# Patient Record
Sex: Male | Born: 1960 | Race: White | Hispanic: No | Marital: Single | Smoking: Never smoker
Health system: Southern US, Community
[De-identification: ages and names within clinical notes are randomized; demographics above are authoritative.]

## PROBLEM LIST (undated history)

## (undated) DIAGNOSIS — K279 Peptic ulcer, site unspecified, unspecified as acute or chronic, without hemorrhage or perforation: Secondary | ICD-10-CM

## (undated) DIAGNOSIS — E079 Disorder of thyroid, unspecified: Secondary | ICD-10-CM

## (undated) DIAGNOSIS — K219 Gastro-esophageal reflux disease without esophagitis: Secondary | ICD-10-CM

---

## 2016-11-11 ENCOUNTER — Emergency Department: Payer: Medicaid Other

## 2016-11-11 ENCOUNTER — Emergency Department
Admission: EM | Admit: 2016-11-11 | Discharge: 2016-11-11 | Disposition: A | Discharge status: Home / Self Care | Payer: Medicaid Other | Attending: Emergency Medicine | Admitting: Emergency Medicine

## 2016-11-11 ENCOUNTER — Encounter: Payer: Self-pay | Admitting: Emergency Medicine

## 2016-11-11 ENCOUNTER — Other Ambulatory Visit: Payer: Self-pay

## 2016-11-11 DIAGNOSIS — R0602 Shortness of breath: Secondary | ICD-10-CM | POA: Diagnosis not present

## 2016-11-11 DIAGNOSIS — R0789 Other chest pain: Secondary | ICD-10-CM | POA: Diagnosis present

## 2016-11-11 DIAGNOSIS — R079 Chest pain, unspecified: Secondary | ICD-10-CM | POA: Diagnosis not present

## 2016-11-11 HISTORY — DX: Disorder of thyroid, unspecified: E07.9

## 2016-11-11 HISTORY — DX: Peptic ulcer, site unspecified, unspecified as acute or chronic, without hemorrhage or perforation: K27.9

## 2016-11-11 HISTORY — DX: Gastro-esophageal reflux disease without esophagitis: K21.9

## 2016-11-11 LAB — CBC
HEMATOCRIT: 44.1 % (ref 40.0–52.0)
Hemoglobin: 15.2 g/dL (ref 13.0–18.0)
MCH: 29.9 pg (ref 26.0–34.0)
MCHC: 34.4 g/dL (ref 32.0–36.0)
MCV: 86.8 fL (ref 80.0–100.0)
Platelets: 193 10*3/uL (ref 150–440)
RBC: 5.09 MIL/uL (ref 4.40–5.90)
RDW: 13.5 % (ref 11.5–14.5)
WBC: 5.7 10*3/uL (ref 3.8–10.6)

## 2016-11-11 LAB — BASIC METABOLIC PANEL
Anion gap: 8 (ref 5–15)
BUN: 18 mg/dL (ref 6–20)
CHLORIDE: 107 mmol/L (ref 101–111)
CO2: 25 mmol/L (ref 22–32)
CREATININE: 1.09 mg/dL (ref 0.61–1.24)
Calcium: 9.2 mg/dL (ref 8.9–10.3)
GFR calc Af Amer: >60 mL/min (ref >60)
GFR calc non Af Amer: >60 mL/min (ref >60)
GLUCOSE: 102 mg/dL — AB (ref 65–99)
POTASSIUM: 3.7 mmol/L (ref 3.5–5.1)
Sodium: 140 mmol/L (ref 135–145)

## 2016-11-11 LAB — TROPONIN I: Troponin I: <0.03 ng/mL (ref <0.03)

## 2016-11-11 MED ORDER — GUAIFENESIN-CODEINE 100-10 MG/5ML PO SOLN: 5.0000 mL | Freq: Four times a day (QID) | ORAL | 0 refills | Status: DC | PRN

## 2016-11-11 NOTE — Discharge Instructions (Signed)
You have been seen in the emergency department today for chest pain. Your workup has shown normal results. As we discussed please follow-up with your primary care physician in the next 1-2 days for recheck. Return to the emergency department for any further chest pain, trouble breathing, or any other symptom personally concerning to yourself.  Please call the number provided for cardiology to arrange a follow-up appointment for consideration of possible stress test.

## 2016-11-11 NOTE — ED Provider Notes (Signed)
[facility] Emergency Department Provider Note  Time seen: 5:23 PM  I have reviewed the triage vital signs and the nursing notes.   HISTORY  Chief Complaint Shortness of Breath    HPI Joe Shaffer is a 56 y.o. male With a past medical history of gastric reflux presents the emergency department for chest discomfort and cough. According to the patient for the past one year he has been experiencing intermittent chest discomfort. States he was incarcerated until 5 months ago. States since getting out of prison he has continued to have intermittent chest pain, but has not yet been properly evaluated. States even in prison when he was evaluated he believes the health care provided was poor. Patient states over the past several weeks he has had increased cough, with mild shortness of breath. Intermittent chest pain which she describes as central chest discomfort. Occasional sputum production. Denies any fever. No leg pain or swelling at this time.  Past Medical History:  Diagnosis Date  . GERD (gastroesophageal reflux disease)   . Peptic ulcer   . Thyroid disease     There are no active problems to display for this patient.   History reviewed. No pertinent surgical history.  Prior to Admission medications   Not on File    No Known Allergies  No family history on file.  Social History Social History  Substance Use Topics  . Smoking status: Never Smoker  . Smokeless tobacco: Never Used  . Alcohol use Not on file    Review of Systems Constitutional: Negative for fever Cardiovascular: intermittent chest pain times one year Respiratory: positive shortness breath, cough. Gastrointestinal: Negative for abdominal pain Musculoskeletal: negative for leg pain or swelling. All other ROS negative  ____________________________________________   PHYSICAL EXAM:  VITAL SIGNS: ED Triage Vitals  Enc Vitals Group     BP 11/11/16 1449 (!) 132/93     Pulse  Rate 11/11/16 1449 82     Resp 11/11/16 1449 20     Temp 11/11/16 1449 98.1 F (36.7 C)     Temp Source 11/11/16 1449 Oral     SpO2 11/11/16 1449 96 %     Weight 11/11/16 1450 224 lb (101.6 kg)     Height 11/11/16 1450  (1.727 m)     Head Circumference --      Peak Flow --      Pain Score 11/11/16 1507 7     Pain Loc --      Pain Edu? --      Excl. in GC? --     Constitutional: Alert and oriented. Well appearing and in no distress. Eyes: Normal exam ENT   Head: Normocephalic and atraumatic.   Mouth/Throat: Mucous membranes are moist. Cardiovascular: Normal rate, regular rhythm. No murmur Respiratory: Normal respiratory effort without tachypnea nor retractions. Breath sounds are clear. No wheezes rales or rhonchi. Gastrointestinal: Soft and nontender. No distention.  Musculoskeletal: Nontender with normal range of motion in all extremities. No lower extremity tenderness Neurologic:  Normal speech and language. No gross focal neurologic deficits  Skin:  Skin is warm, dry and intact.  Psychiatric: Mood and affect are normal. Speech and behavior are normal.   ____________________________________________    EKG  EKG reviewed and interpreted by myself as normal sinus rhythm at 84 bpm, narrow QRS, normal axis, normal intervals, no concerning ST changes.  ____________________________________________    RADIOLOGY  chest x-ray does not show any acute abnormality.  ____________________________________________   INITIAL  IMPRESSION / ASSESSMENT AND PLAN / ED COURSE  Pertinent labs & imaging results that were available during my care of the patient were reviewed by me and considered in my medical decision making (see chart for details).  patient presents to the emergency department for cough or shortness of breath and intermittent chest pain. States his symptoms have been ongoing for over a year but worse over the past 1-2 weeks. Patient sent by his primary care doctor  for further evaluation. Differential this time would include ACS, bronchitis, COPD, pneumonia, URI. Patient's workup has been largely normal. His labs including troponin are negative/normal. Chest x-ray shows hyperinflation but no acute abnormality. EKG is reassuring. Overall the patient appears very well with a normal physical exam. Patient does have chest pain at times, coughing at times. However given his normal workup I believe the patient is safe for discharge home with cardiology follow-up for a stress test. We will prescribe a short course of cough medication to help with the patient's symptoms. I provided my normal chest pain return precautions.  ____________________________________________   FINAL CLINICAL IMPRESSION(S) / ED DIAGNOSES  chest pain    Minna Antis, MD 11/11/16 1727

## 2016-11-11 NOTE — ED Triage Notes (Signed)
Went to [facility].  Was sent for chest xray, but they did not order it and instructed him to come to ED.  Pt says he has chest pain that is pressure.

## 2016-12-29 ENCOUNTER — Ambulatory Visit: Payer: Medicaid Other | Attending: Neurology

## 2016-12-29 DIAGNOSIS — G4761 Periodic limb movement disorder: Secondary | ICD-10-CM | POA: Insufficient documentation

## 2016-12-29 DIAGNOSIS — G4733 Obstructive sleep apnea (adult) (pediatric): Secondary | ICD-10-CM | POA: Diagnosis not present

## 2016-12-29 DIAGNOSIS — G4710 Hypersomnia, unspecified: Secondary | ICD-10-CM | POA: Insufficient documentation

## 2017-02-03 ENCOUNTER — Ambulatory Visit: Payer: Medicaid Other | Attending: Neurology

## 2017-02-03 DIAGNOSIS — G4761 Periodic limb movement disorder: Secondary | ICD-10-CM | POA: Diagnosis not present

## 2017-02-03 DIAGNOSIS — G4733 Obstructive sleep apnea (adult) (pediatric): Secondary | ICD-10-CM | POA: Diagnosis not present

## 2017-12-31 ENCOUNTER — Ambulatory Visit: Payer: Medicaid Other | Attending: Neurology

## 2017-12-31 DIAGNOSIS — G4761 Periodic limb movement disorder: Secondary | ICD-10-CM | POA: Insufficient documentation

## 2017-12-31 DIAGNOSIS — G4733 Obstructive sleep apnea (adult) (pediatric): Secondary | ICD-10-CM | POA: Diagnosis present

## 2018-02-19 ENCOUNTER — Encounter: Payer: Self-pay | Admitting: Internal Medicine

## 2018-02-19 ENCOUNTER — Emergency Department: Payer: Medicaid Other

## 2018-02-19 ENCOUNTER — Other Ambulatory Visit: Payer: Self-pay

## 2018-02-19 ENCOUNTER — Emergency Department
Admission: EM | Admit: 2018-02-19 | Discharge: 2018-02-19 | Disposition: A | Discharge status: Home / Self Care | Payer: Medicaid Other | Attending: Emergency Medicine | Admitting: Emergency Medicine

## 2018-02-19 DIAGNOSIS — J101 Influenza due to other identified influenza virus with other respiratory manifestations: Secondary | ICD-10-CM | POA: Diagnosis not present

## 2018-02-19 DIAGNOSIS — R0602 Shortness of breath: Secondary | ICD-10-CM | POA: Diagnosis present

## 2018-02-19 DIAGNOSIS — J111 Influenza due to unidentified influenza virus with other respiratory manifestations: Secondary | ICD-10-CM

## 2018-02-19 LAB — GROUP A STREP BY PCR: GROUP A STREP BY PCR: NOT DETECTED

## 2018-02-19 LAB — INFLUENZA PANEL BY PCR (TYPE A & B)
INFLAPCR: NEGATIVE
INFLBPCR: POSITIVE — AB

## 2018-02-19 MED ORDER — HYDROCOD POLST-CPM POLST ER 10-8 MG/5ML PO SUER: 5.0000 mL | Freq: Every evening | ORAL | 0 refills | Status: DC | PRN

## 2018-02-19 MED ORDER — DEXAMETHASONE SODIUM PHOSPHATE 10 MG/ML IJ SOLN
10.0000 mg | Freq: Once | INTRAMUSCULAR | Status: AC
  Administered 2018-02-19: 10 mg via INTRAMUSCULAR
  Filled 2018-02-19: qty 1

## 2018-02-19 NOTE — ED Provider Notes (Addendum)
[facility] Emergency Department Provider Note ____________________________________________  Time seen: 2115  I have reviewed the triage vital signs and the nursing notes.  HISTORY  Chief Complaint  Cough and Shortness of Breath   HPI Joe Shaffer is a 58 y.o. male presents to the ER today with complaint of runny nose, nasal congestion gesturing, ear fullness, sore throat, cough and shortness of breath.  He reports this started 1 week ago.  He is not able to blow anything out of his nose.  He denies drainage from the ears but does report some decreased hearing.  He is having some difficulty swallowing.  The cough is productive of yellow/green mucus.  He is short of breath with exertion but denies chest pain.  He denies fever, chills or body aches.  He has taken NyQuil over-the-counter with minimal relief.  He has no history of asthma or COPD.  He has not had sick contacts.  He did not get his flu shot this year.  Past Medical History:  Diagnosis Date  . GERD (gastroesophageal reflux disease)   . Peptic ulcer   . Thyroid disease     There are no active problems to display for this patient.   History reviewed. No pertinent surgical history.  Prior to Admission medications   Medication Sig Start Date End Date Taking? Authorizing Provider  chlorpheniramine-HYDROcodone (TUSSIONEX PENNKINETIC ER) 10-8 MG/5ML SUER Take 5 mLs by mouth at bedtime as needed for cough. 02/19/18   Lorre Munroe, NP    Allergies Patient has no known allergies.  No family history on file.  Social History Social History   Tobacco Use  . Smoking status: Never Smoker  . Smokeless tobacco: Never Used  Substance Use Topics  . Alcohol use: Not on file  . Drug use: Not on file    Review of Systems  Constitutional: Negative for fever, chills or body aches. ENT: Positive for runny nose, nasal congestion, ear fullness and sore throat. Cardiovascular: Negative for chest  pain. Respiratory: Positive for cough and shortness of breath. Gastrointestinal: Negative for nausea, vomiting and diarrhea.  ____________________________________________  PHYSICAL EXAM:  VITAL SIGNS: ED Triage Vitals  Enc Vitals Group     BP 02/19/18 2041 132/77     Pulse Rate 02/19/18 2041 84     Resp 02/19/18 2041 18     Temp 02/19/18 2041 98.6 F (37 C)     Temp Source 02/19/18 2041 Oral     SpO2 02/19/18 2041 99 %     Weight 02/19/18 2038 222 lb (100.7 kg)     Height 02/19/18 2038 5\' 8"  (1.727 m)     Head Circumference --      Peak Flow --      Pain Score 02/19/18 2038 0     Pain Loc --      Pain Edu? --      Excl. in GC? --     Constitutional: Alert and oriented. Well appearing and in no distress. Head: Normocephalic and atraumatic. Ears: Bilateral cerumen impaction. Nose: Mucosa boggy and moist, turbinates swollen. Mouth/Throat: Mucous membranes are moist.  Tonsils 2+ with erythema but without exudate.  Positive PND. Hematological/Lymphatic/Immunological: No cervical lymphadenopathy. Cardiovascular: Normal rate, regular rhythm.  Respiratory: Normal respiratory effort with diminished breath sounds. No wheezes/rales/rhonchi. Neurologic:   Normal speech and language. No gross focal neurologic deficits are appreciated.  ____________________________________________   LABS   Lab Orders     Group A Strep by PCR ([facility] Only)  Influenza panel by PCR (type A & B)  ____________________________________________   RADIOLOGY  Imaging Orders     DG Chest 2 View IMPRESSION:  No acute abnormalities.      ____________________________________________   INITIAL IMPRESSION / ASSESSMENT AND PLAN / ED COURSE  Runny Nose, Ear Fullness, Nasal Congestion, Sore Throat, Cough and SOB:  DDX include allergic rhinitis, viral URI with cough, strep pharyngitis, viral pharyngitis, acute bronchitis or pneumonia  Discussed lack of reasoning to test for the flu as he is 5 days  out Chest xray negative Rapid Strep negative Rapid flu positive Too far out for Tamiflu Decadron 10 mg IM today Use Flonase, Allegra OTC RX for Tussionex for cough ____________________________________________  FINAL CLINICAL IMPRESSION(S) / ED DIAGNOSES  Final diagnoses:  Influenza   Nicki Reaper, NP     Lorre Munroe, NP 02/19/18 2300    Lorre Munroe, NP 02/19/18 2303    Emily Filbert, MD 02/19/18 [phone]

## 2018-02-19 NOTE — ED Triage Notes (Signed)
Patient reports cough for several days that makes him short of breath.  Reports productive with occasional green sputum.

## 2018-02-19 NOTE — Discharge Instructions (Addendum)
You have been diagnosed with the flu.  Your chest x-ray was negative for pneumonia.  You are too far out to be treated with Tamiflu.  I suggest you start Allegra 1 tab daily at night x1 week.  I suggest you start Flonase 1 spray each nostril daily in the morning x1 week.  I am giving you a prescription for codeine cough syrup.  Follow-up as needed with your PCP if symptoms persist or worsen

## 2018-06-30 ENCOUNTER — Emergency Department: Payer: Medicaid Other

## 2018-06-30 ENCOUNTER — Emergency Department
Admission: EM | Admit: 2018-06-30 | Discharge: 2018-06-30 | Disposition: A | Discharge status: Home / Self Care | Payer: Medicaid Other | Attending: Emergency Medicine | Admitting: Emergency Medicine

## 2018-06-30 ENCOUNTER — Other Ambulatory Visit: Payer: Self-pay

## 2018-06-30 ENCOUNTER — Encounter: Payer: Self-pay | Admitting: Emergency Medicine

## 2018-06-30 DIAGNOSIS — Y998 Other external cause status: Secondary | ICD-10-CM | POA: Diagnosis not present

## 2018-06-30 DIAGNOSIS — F0781 Postconcussional syndrome: Secondary | ICD-10-CM | POA: Diagnosis not present

## 2018-06-30 DIAGNOSIS — Y9389 Activity, other specified: Secondary | ICD-10-CM | POA: Diagnosis not present

## 2018-06-30 DIAGNOSIS — S0990XA Unspecified injury of head, initial encounter: Secondary | ICD-10-CM | POA: Insufficient documentation

## 2018-06-30 DIAGNOSIS — Y9289 Other specified places as the place of occurrence of the external cause: Secondary | ICD-10-CM | POA: Insufficient documentation

## 2018-06-30 MED ORDER — HYDROCODONE-ACETAMINOPHEN 5-325 MG PO TABS: 1.0000 | ORAL_TABLET | Freq: Four times a day (QID) | ORAL | 0 refills | Status: DC | PRN
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
## 2018-06-30 NOTE — Discharge Instructions (Addendum)
Follow-up with your regular doctor as needed.  Follow-up with Dr. Malvin Johns at [facility] due to the postconcussion syndrome.  Typically we do not give pain medication for headaches associated with concussions.  Take the pain medication sparingly.  Try Tylenol first.  If this helps to alleviate the pain do not take the narcotic pain medicine.

## 2018-06-30 NOTE — ED Triage Notes (Signed)
Alert oriented, no confusion noted. Denies vomiting.
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
## 2018-10-24 ENCOUNTER — Encounter: Payer: Self-pay | Admitting: Emergency Medicine

## 2018-10-24 ENCOUNTER — Emergency Department
Admission: EM | Admit: 2018-10-24 | Discharge: 2018-10-25 | Disposition: A | Discharge status: Home / Self Care | Payer: Medicaid Other | Attending: Emergency Medicine | Admitting: Emergency Medicine

## 2018-10-24 ENCOUNTER — Emergency Department: Payer: Medicaid Other

## 2018-10-24 ENCOUNTER — Other Ambulatory Visit: Payer: Self-pay

## 2018-10-24 DIAGNOSIS — F1010 Alcohol abuse, uncomplicated: Secondary | ICD-10-CM

## 2018-10-24 DIAGNOSIS — F1110 Opioid abuse, uncomplicated: Secondary | ICD-10-CM | POA: Diagnosis not present

## 2018-10-24 DIAGNOSIS — R51 Headache: Secondary | ICD-10-CM | POA: Insufficient documentation

## 2018-10-24 DIAGNOSIS — R7989 Other specified abnormal findings of blood chemistry: Secondary | ICD-10-CM | POA: Insufficient documentation

## 2018-10-24 DIAGNOSIS — R413 Other amnesia: Secondary | ICD-10-CM | POA: Diagnosis not present

## 2018-10-24 DIAGNOSIS — Z20828 Contact with and (suspected) exposure to other viral communicable diseases: Secondary | ICD-10-CM | POA: Insufficient documentation

## 2018-10-24 DIAGNOSIS — Z79899 Other long term (current) drug therapy: Secondary | ICD-10-CM | POA: Diagnosis not present

## 2018-10-24 LAB — COMPREHENSIVE METABOLIC PANEL
ALT: 35 U/L (ref 0–44)
AST: 43 U/L — ABNORMAL HIGH (ref 15–41)
Albumin: 4.3 g/dL (ref 3.5–5.0)
Alkaline Phosphatase: 67 U/L (ref 38–126)
Anion gap: 12 (ref 5–15)
BUN: 16 mg/dL (ref 6–20)
CO2: 24 mmol/L (ref 22–32)
Calcium: 9.4 mg/dL (ref 8.9–10.3)
Chloride: 103 mmol/L (ref 98–111)
Creatinine, Ser: 0.99 mg/dL (ref 0.61–1.24)
GFR calc Af Amer: >60 mL/min (ref >60)
GFR calc non Af Amer: >60 mL/min (ref >60)
Glucose, Bld: 119 mg/dL — ABNORMAL HIGH (ref 70–99)
Potassium: 3.5 mmol/L (ref 3.5–5.1)
Sodium: 139 mmol/L (ref 135–145)
Total Bilirubin: 1.4 mg/dL — ABNORMAL HIGH (ref 0.3–1.2)
Total Protein: 7.5 g/dL (ref 6.5–8.1)

## 2018-10-24 LAB — URINE DRUG SCREEN, QUALITATIVE (ARMC ONLY)
Amphetamines, Ur Screen: NOT DETECTED
Barbiturates, Ur Screen: NOT DETECTED
Benzodiazepine, Ur Scrn: NOT DETECTED
Cannabinoid 50 Ng, Ur ~~LOC~~: NOT DETECTED
Cocaine Metabolite,Ur ~~LOC~~: NOT DETECTED
MDMA (Ecstasy)Ur Screen: NOT DETECTED
Methadone Scn, Ur: NOT DETECTED
Opiate, Ur Screen: POSITIVE — AB
Phencyclidine (PCP) Ur S: NOT DETECTED
Tricyclic, Ur Screen: NOT DETECTED

## 2018-10-24 LAB — TSH: TSH: 9.991 u[IU]/mL — ABNORMAL HIGH (ref 0.350–4.500)

## 2018-10-24 LAB — CBC
HCT: 47.3 % (ref 39.0–52.0)
Hemoglobin: 16.4 g/dL (ref 13.0–17.0)
MCH: 31.8 pg (ref 26.0–34.0)
MCHC: 34.7 g/dL (ref 30.0–36.0)
MCV: 91.7 fL (ref 80.0–100.0)
Platelets: 180 10*3/uL (ref 150–400)
RBC: 5.16 MIL/uL (ref 4.22–5.81)
RDW: 13.2 % (ref 11.5–15.5)
WBC: 7.4 10*3/uL (ref 4.0–10.5)
nRBC: 0 % (ref 0.0–0.2)

## 2018-10-24 LAB — ETHANOL: Alcohol, Ethyl (B): <10 mg/dL (ref <10)
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
## 2018-10-24 NOTE — ED Triage Notes (Signed)
Pt arrived via POV with reports of needing help with alcohol problem  Pt states he has hx of 18 year sobriety, recently started drinking again 12-24 beers per day, pt states he last drank 2 days ago.  PT states he has been sick all day with cold chills and sweats, headache, nauseated, feels like bugs crawling around skin.   Pt denies any SI/HI, but states he has anger problems.  Pt states sometimes he can't think straight and has a memory pill that has been prescribed to him.

## 2018-10-24 NOTE — ED Notes (Signed)
Pt to the er to seek assistance for detox due to etoh abuse. Pt states he needs to quit drinking. Pt states he had been clean for 18 years and began drinking again intermittently over the last year. Pt states he now drinks everyday. Pt states he has a supportive family. Pt says he is not living anywhere now but he can find a place. Pt states he last drank 2 days ago. Pt states he used to go to [employer] every Thursday from 2000-2100. Pt is calm and cooperative.

## 2018-10-25 LAB — SARS CORONAVIRUS 2 (TAT 6-24 HRS): SARS Coronavirus 2: NEGATIVE

## 2018-10-25 MED ORDER — LEVOTHYROXINE SODIUM 112 MCG PO TABS
112.0000 ug | ORAL_TABLET | Freq: Every day | ORAL | Status: DC
  Administered 2018-10-25: 06:00:00 112 ug via ORAL
  Filled 2018-10-25: qty 1

## 2018-10-25 MED ORDER — PANTOPRAZOLE SODIUM 40 MG PO TBEC
40.0000 mg | DELAYED_RELEASE_TABLET | Freq: Every day | ORAL | Status: DC
  Administered 2018-10-25: 40 mg via ORAL
  Filled 2018-10-25: qty 1

## 2018-10-25 NOTE — ED Notes (Signed)
Pt does not want to shower at this time.

## 2018-10-25 NOTE — ED Notes (Signed)
Pt given meal tray.

## 2018-10-25 NOTE — ED Notes (Signed)
Belongings bag returned to pt.

## 2018-10-25 NOTE — ED Notes (Signed)
Pt has been accepted at [facility]. Pt made aware that they will come pick him up at 10am.

## 2018-10-25 NOTE — ED Provider Notes (Addendum)
-----------------------------------------   7:18 AM on 10/25/2018 -----------------------------------------   Blood pressure (!) 157/98, pulse 78, temperature 98.8 F (37.1 C), temperature source Oral, resp. rate 18, height 5\' 8"  (1.727 m), weight 90.7 kg, SpO2 99 %.  The patient is calm and cooperative at this time.  There have been no acute events since the last update.  TTS referred patient to [facility] and [facility]; awaiting acceptance.   Paulette Blanch, MD 10/25/18 [phone]   ----------------------------------------- 7:23 AM on 10/25/2018 -----------------------------------------  I have been told that patient has been accepted at [facility] and will be picked up at 10 AM.   Paulette Blanch, MD 10/25/18 [phone]

## 2018-10-25 NOTE — ED Notes (Signed)
Pt denies any needs at this time. Pt asks about getting his morning meds. Will have am meds ordered.

## 2018-10-25 NOTE — ED Notes (Signed)
Patient has been referred to [facility] and [facility]

## 2018-10-25 NOTE — ED Notes (Signed)
Pt continues to sit on the side of the bed and read his book. NO distress noted.

## 2018-10-25 NOTE — ED Notes (Signed)
PT given water.

## 2018-10-25 NOTE — ED Notes (Signed)
[facility] here to pick pt up.

## 2018-10-25 NOTE — Discharge Instructions (Signed)
A representative from [facility] will pick you up at 10 AM.  Return to the ER for worsening symptoms, persistent vomiting, difficulty breathing or other concerns.

## 2018-10-25 NOTE — ED Notes (Signed)
Patient has been accepted to [facility] ([facility])  Representative was [place].   ER Staff is aware of it:  Dr. Beather Arbour ER MD  Sherri Patient's Nurse     [facility] will arrive to pick up the patient around 10:00 a.m.

## 2020-12-19 ENCOUNTER — Emergency Department (HOSPITAL_COMMUNITY): Payer: Medicaid Other

## 2020-12-19 ENCOUNTER — Emergency Department (HOSPITAL_COMMUNITY)
Admission: EM | Admit: 2020-12-19 | Discharge: 2020-12-19 | Disposition: A | Discharge status: Home / Self Care | Payer: Medicaid Other | Attending: Emergency Medicine | Admitting: Emergency Medicine

## 2020-12-19 DIAGNOSIS — S20211A Contusion of right front wall of thorax, initial encounter: Secondary | ICD-10-CM | POA: Insufficient documentation

## 2020-12-19 DIAGNOSIS — Y92410 Unspecified street and highway as the place of occurrence of the external cause: Secondary | ICD-10-CM | POA: Diagnosis not present

## 2020-12-19 DIAGNOSIS — S0081XA Abrasion of other part of head, initial encounter: Secondary | ICD-10-CM

## 2020-12-19 DIAGNOSIS — Y900 Blood alcohol level of less than 20 mg/100 ml: Secondary | ICD-10-CM | POA: Insufficient documentation

## 2020-12-19 DIAGNOSIS — T1490XA Injury, unspecified, initial encounter: Secondary | ICD-10-CM

## 2020-12-19 DIAGNOSIS — S0990XA Unspecified injury of head, initial encounter: Secondary | ICD-10-CM | POA: Diagnosis present

## 2020-12-19 DIAGNOSIS — Z79899 Other long term (current) drug therapy: Secondary | ICD-10-CM | POA: Diagnosis not present

## 2020-12-19 DIAGNOSIS — S0181XA Laceration without foreign body of other part of head, initial encounter: Secondary | ICD-10-CM

## 2020-12-19 DIAGNOSIS — T07XXXA Unspecified multiple injuries, initial encounter: Secondary | ICD-10-CM

## 2020-12-19 DIAGNOSIS — S8012XA Contusion of left lower leg, initial encounter: Secondary | ICD-10-CM | POA: Diagnosis not present

## 2020-12-19 DIAGNOSIS — Z23 Encounter for immunization: Secondary | ICD-10-CM | POA: Insufficient documentation

## 2020-12-19 DIAGNOSIS — S60511A Abrasion of right hand, initial encounter: Secondary | ICD-10-CM | POA: Diagnosis not present

## 2020-12-19 DIAGNOSIS — S0101XA Laceration without foreign body of scalp, initial encounter: Secondary | ICD-10-CM | POA: Insufficient documentation

## 2020-12-19 DIAGNOSIS — I609 Nontraumatic subarachnoid hemorrhage, unspecified: Secondary | ICD-10-CM

## 2020-12-19 DIAGNOSIS — S3991XA Unspecified injury of abdomen, initial encounter: Secondary | ICD-10-CM | POA: Insufficient documentation

## 2020-12-19 DIAGNOSIS — S8011XA Contusion of right lower leg, initial encounter: Secondary | ICD-10-CM | POA: Diagnosis not present

## 2020-12-19 DIAGNOSIS — S066X0A Traumatic subarachnoid hemorrhage without loss of consciousness, initial encounter: Secondary | ICD-10-CM | POA: Diagnosis not present

## 2020-12-19 LAB — I-STAT CHEM 8, ED
BUN: 11 mg/dL (ref 6–20)
Calcium, Ion: 1.06 mmol/L — ABNORMAL LOW (ref 1.15–1.40)
Chloride: 104 mmol/L (ref 98–111)
Creatinine, Ser: 0.8 mg/dL (ref 0.61–1.24)
Glucose, Bld: 118 mg/dL — ABNORMAL HIGH (ref 70–99)
HCT: 41 % (ref 39.0–52.0)
Hemoglobin: 13.9 g/dL (ref 13.0–17.0)
Potassium: 3.5 mmol/L (ref 3.5–5.1)
Sodium: 141 mmol/L (ref 135–145)
TCO2: 24 mmol/L (ref 22–32)

## 2020-12-19 LAB — COMPREHENSIVE METABOLIC PANEL
ALT: 18 U/L (ref 0–44)
AST: 27 U/L (ref 15–41)
Albumin: 3.7 g/dL (ref 3.5–5.0)
Alkaline Phosphatase: 41 U/L (ref 38–126)
Anion gap: 9 (ref 5–15)
BUN: 10 mg/dL (ref 6–20)
CO2: 25 mmol/L (ref 22–32)
Calcium: 8.9 mg/dL (ref 8.9–10.3)
Chloride: 106 mmol/L (ref 98–111)
Creatinine, Ser: 0.82 mg/dL (ref 0.61–1.24)
GFR, Estimated: >60 mL/min (ref >60)
Glucose, Bld: 90 mg/dL (ref 70–99)
Potassium: 3.7 mmol/L (ref 3.5–5.1)
Sodium: 140 mmol/L (ref 135–145)
Total Bilirubin: 0.7 mg/dL (ref 0.3–1.2)
Total Protein: 6.3 g/dL — ABNORMAL LOW (ref 6.5–8.1)

## 2020-12-19 LAB — CBC
HCT: 43.3 % (ref 39.0–52.0)
Hemoglobin: 14.4 g/dL (ref 13.0–17.0)
MCH: 30.5 pg (ref 26.0–34.0)
MCHC: 33.3 g/dL (ref 30.0–36.0)
MCV: 91.7 fL (ref 80.0–100.0)
Platelets: 195 10*3/uL (ref 150–400)
RBC: 4.72 MIL/uL (ref 4.22–5.81)
RDW: 14.1 % (ref 11.5–15.5)
WBC: 5.1 10*3/uL (ref 4.0–10.5)
nRBC: 0 % (ref 0.0–0.2)

## 2020-12-19 LAB — APTT: aPTT: 27 seconds (ref 24–36)

## 2020-12-19 LAB — LACTIC ACID, PLASMA: Lactic Acid, Venous: 1.8 mmol/L (ref 0.5–1.9)

## 2020-12-19 LAB — PROTIME-INR
INR: 1.1 (ref 0.8–1.2)
Prothrombin Time: 13.9 seconds (ref 11.4–15.2)

## 2020-12-19 LAB — ETHANOL: Alcohol, Ethyl (B): <10 mg/dL (ref <10)

## 2020-12-19 MED ORDER — BACITRACIN ZINC 500 UNIT/GM EX OINT
TOPICAL_OINTMENT | Freq: Once | CUTANEOUS | Status: AC
  Administered 2020-12-19: 1 via TOPICAL
  Filled 2020-12-19: qty 0.9

## 2020-12-19 MED ORDER — IOHEXOL 300 MG/ML  SOLN
80.0000 mL | Freq: Once | INTRAMUSCULAR | Status: AC | PRN
  Administered 2020-12-19: 80 mL via INTRAVENOUS

## 2020-12-19 MED ORDER — HYDROMORPHONE HCL 1 MG/ML IJ SOLN
1.0000 mg | Freq: Once | INTRAMUSCULAR | Status: AC
  Administered 2020-12-19: 1 mg via INTRAVENOUS
  Filled 2020-12-19: qty 1

## 2020-12-19 MED ORDER — CEFAZOLIN SODIUM-DEXTROSE 2-4 GM/100ML-% IV SOLN
2.0000 g | Freq: Once | INTRAVENOUS | Status: AC
  Administered 2020-12-19: 2 g via INTRAVENOUS

## 2020-12-19 MED ORDER — ONDANSETRON HCL 4 MG/2ML IJ SOLN: 4.0000 mg | Freq: Once | INTRAMUSCULAR | Status: AC

## 2020-12-19 MED ORDER — FENTANYL CITRATE (PF) 100 MCG/2ML IJ SOLN
INTRAMUSCULAR | Status: AC
  Filled 2020-12-19: qty 2

## 2020-12-19 MED ORDER — FENTANYL CITRATE PF 50 MCG/ML IJ SOSY
50.0000 ug | PREFILLED_SYRINGE | Freq: Once | INTRAMUSCULAR | Status: AC
  Administered 2020-12-19: 50 ug via INTRAVENOUS

## 2020-12-19 MED ORDER — HYDROMORPHONE HCL 1 MG/ML IJ SOLN
0.5000 mg | Freq: Once | INTRAMUSCULAR | Status: AC
  Administered 2020-12-19: 0.5 mg via INTRAVENOUS
  Filled 2020-12-19: qty 1

## 2020-12-19 MED ORDER — ONDANSETRON HCL 4 MG/2ML IJ SOLN
INTRAMUSCULAR | Status: AC
  Administered 2020-12-19: 4 mg via INTRAVENOUS
  Filled 2020-12-19: qty 2

## 2020-12-19 MED ORDER — HYDROCODONE-ACETAMINOPHEN 5-325 MG PO TABS: 1.0000 | ORAL_TABLET | Freq: Four times a day (QID) | ORAL | 0 refills | Status: AC | PRN

## 2020-12-19 MED ORDER — TETANUS-DIPHTH-ACELL PERTUSSIS 5-2.5-18.5 LF-MCG/0.5 IM SUSY
0.5000 mL | PREFILLED_SYRINGE | Freq: Once | INTRAMUSCULAR | Status: AC
  Administered 2020-12-19: 0.5 mL via INTRAMUSCULAR
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
## 2020-12-19 NOTE — ED Notes (Signed)
Patient transported to CT 
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
## 2020-12-19 NOTE — ED Notes (Signed)
Pt face and hand wounds cleaned.

## 2020-12-19 NOTE — ED Notes (Signed)
Bleeding controlled.
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
## 2020-12-19 NOTE — ED Notes (Signed)
Delay in CT due to arterial bleed from laceration to right side of forehead. Repair needed prior to scans.

## 2020-12-19 NOTE — ED Notes (Signed)
Dr Anitra Lauth at bedside to suture head laceration and control bleeding.
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
## 2023-08-10 ENCOUNTER — Ambulatory Visit: Admitting: Family Medicine

## 2023-08-10 DIAGNOSIS — Z113 Encounter for screening for infections with a predominantly sexual mode of transmission: Secondary | ICD-10-CM | POA: Diagnosis not present

## 2023-08-10 LAB — HM HEPATITIS C SCREENING LAB: HM Hepatitis Screen: NEGATIVE

## 2023-08-10 LAB — HM HIV SCREENING LAB: HM HIV Screening: NEGATIVE

## 2023-08-10 NOTE — Progress Notes (Signed)
 Patient here for STD screening. Condoms declined. All questions answered and verbalizes understanding.   Clare Critchley, RN

## 2023-08-10 NOTE — Patient Instructions (Signed)
 STI screening - Today we obtained a urine sample to screen for gonorrhea and chlamydia and oral swab to screen for gonorrhea - We also obtained a blood sample to screen for HIV, syphilis, Hepatitis B, and Hepatitis C  - If the results are abnormal, I will give you a call.    Estimated time frame for results collected at the [facility] Department: Same day Trichomonas Yeast BV (bacterial vaginosis)  Within 1-2 weeks Gonorrhea Chlamydia  Within 2-3 weeks HIV Syphilis Hepatitis B Hepatitis C

## 2023-08-10 NOTE — Progress Notes (Signed)
 [facility] Department STI clinic [address] [place] [address] Main phone: [phone]  STI screening visit  Subjective:  Joe Shaffer is a 63 y.o. male being seen today for an STI screening visit. The patient reports they do have symptoms.    Patient has the following medical conditions:  There are no active problems to display for this patient.  Chief Complaint  Patient presents with   SEXUALLY TRANSMITTED DISEASE   HPI Patient reports chronic worsening burning with urination. Cannot pinpoint when this started. Last intercourse more than 2 months ago.  See flowsheet for further details and programmatic requirements  Hyperlink available at the top of the signed note in blue.  Flow sheet content below:  Pregnancy Intention Screening Does the patient want to become pregnant in the next year?: No Does the patient's partner want to become pregnant in the next year?: No Would the patient like to discuss contraceptive options today?: No All Patients Anyone smoke around pt and/or pt's children?: No Anyone smoke inside pt's house?: No Anyone smoke inside car?: No Anyone smoke inside the workplace?: No Reason For STD Screen Have you ever had an STD?: Yes STD Symptoms Denies all: No Genital Itching: No Lower abdominal pain: No Discharge: No Dysuria: Yes Genital ulcer / lesion: No Rash: No Oral / Other skin ulcer: No Pain with sex: No Sore Throat: No Visual Changes: No Vaginal Bleeding: No Risk Factors for Hep B Household, sexual, or needle sharing contact of a person infected with Hep B: No Sexual contact with a person who uses drugs not as prescribed?: No Currently or Ever used drugs not as prescribed: Yes HIV Positive: No PRep Patient: No Men who have sex with men: No Have Hepatitis C: No History of Incarceration: Yes History of Homeslessness?: No Anal sex following anal drug use?: No Risk Factors for Hep C Currently  using drugs not as prescribed: No Sexual partner(s) currently using drugs as not prescribed: No History of drug use: Yes HIV Positive: No People with a history of incarceration: Yes People born between the years of [birth year] and [birth year]: Yes Hepatitis Counseling Hep B Counseling: Counseled patient about increased risk of Hep B and recommendation for testing, Patient accepts testing for Hep B today Hep C Counseling: Counseled patient about increased risk of Hep C and recommendation for testing, Patient accepts testing for Hep C today Abuse History Has patient ever been abused physically?: No Has patient ever been abused sexually?: No Does patient feel they have a problem with Anxiety?: No Does patient feel they have a problem with Depression?: No Counseling Patient counseled to use condoms with all sex: Condoms declined RTC in 2-3 weeks for test results: Yes Clinic will call if test results abnormal before test result appt.: Yes Test results given to patient Patient counseled to use condoms with all sex: Condoms declined  Screening for MPX risk: Does the patient have an unexplained rash? No Is the patient MSM? No Does the patient endorse multiple sex partners or anonymous sex partners? No Did the patient have close or sexual contact with a person diagnosed with MPX? No Has the patient traveled outside the [place]  where MPX is endemic? No Is there a high clinical suspicion for MPX-- evidenced by one of the following No  -Unlikely to be chickenpox  -Lymphadenopathy  -Rash that present in same phase of evolution on any given body part  STI screening history: Last HIV test per patient/review of record was No  results found for: HMHIVSCREEN No results found for: HIV  Last HEPC test per patient/review of record was No results found for: HMHEPCSCREEN No components found for: HEPC   Last HEPB test per patient/review of record was No components found for: HMHEPBSCREEN   Fertility: Does the  patient or their partner desires a pregnancy in the next year? No  Immunization History  Administered Date(s) Administered   Tdap 12/19/2020    The following portions of the patient's history were reviewed and updated as appropriate: allergies, current medications, past medical history, past social history, past surgical history and problem list.  Objective:  There were no vitals filed for this visit.  Physical Exam Vitals and nursing note reviewed.  Constitutional:      Appearance: Normal appearance.  HENT:     Head: Normocephalic and atraumatic.     Mouth/Throat:     Mouth: Mucous membranes are moist.     Pharynx: No oropharyngeal exudate or posterior oropharyngeal erythema.   Eyes:     General: No scleral icterus.       Right eye: No discharge.        Left eye: No discharge.     Conjunctiva/sclera:     Right eye: Right conjunctiva is not injected. No exudate.    Left eye: Left conjunctiva is not injected. No exudate.  Pulmonary:     Effort: Pulmonary effort is normal.  Abdominal:     Palpations: There is no hepatomegaly.  Genitourinary:    Comments: Declined genital exam- asymptomatic  Musculoskeletal:     Cervical back: Neck supple. No rigidity or tenderness.  Lymphadenopathy:     Cervical: No cervical adenopathy.     Upper Body:     Right upper body: No supraclavicular adenopathy.     Left upper body: No supraclavicular adenopathy.   Skin:    General: Skin is warm and dry.     Coloration: Skin is not jaundiced or pale.     Findings: No bruising, erythema, lesion or rash.   Neurological:     Mental Status: He is alert and oriented to person, place, and time.    Assessment and Plan:  Joe Shaffer is a 63 y.o. male presenting to the [facility] Department for STI screening  Screening examination for venereal disease -     Chlamydia/GC NAA, Confirmation -     Gonococcus culture -     Syphilis Serology, [facility] -     HIV/HCV [place]  [facility] -     HBV Antigen/Antibody [facility]  Patient does have STI symptoms Patient accepted the following screenings: oral GC culture, urine CT/GC, HIV, RPR, Hep B, and Hep C Patient meets criteria for HepB screening? Yes. Ordered? yes Patient meets criteria for HepC screening? Yes. Ordered? yes Recommended condom use with all sex Discussed importance of condom use for STI prevention  Treat positive test results per standing order. Discussed time line for State Lab results and that patient will be called with positive results and encouraged patient to call if he had not heard in 2 weeks Recommended repeat testing in 3 months with positive results. Recommended returning for continued or worsening symptoms.   No follow-ups on file.  No future appointments.  Betsey CHRISTELLA Helling, MD

## 2023-08-12 LAB — HBV ANTIGEN/ANTIBODY STATE LAB
Hep B Core Total Ab: NONREACTIVE
Hep B S Ab: NONREACTIVE
Hepatitis B Surface Antigen: NONREACTIVE

## 2023-08-15 LAB — GONOCOCCUS CULTURE

## 2023-08-15 LAB — CHLAMYDIA/GC NAA, CONFIRMATION
Chlamydia trachomatis, NAA: NEGATIVE
Neisseria gonorrhoeae, NAA: NEGATIVE

## 2023-08-25 ENCOUNTER — Encounter: Payer: Self-pay | Admitting: Family Medicine

## 2023-12-10 ENCOUNTER — Ambulatory Visit: Admitting: Family Medicine

## 2023-12-10 DIAGNOSIS — Z113 Encounter for screening for infections with a predominantly sexual mode of transmission: Secondary | ICD-10-CM | POA: Diagnosis not present

## 2023-12-10 DIAGNOSIS — Z049 Encounter for examination and observation for unspecified reason: Secondary | ICD-10-CM

## 2023-12-10 LAB — HM HIV SCREENING LAB: HM HIV Screening: NEGATIVE

## 2023-12-10 NOTE — Progress Notes (Signed)
 Pt is here STD screening. Condoms declined. Austine Lefort, RN.

## 2023-12-10 NOTE — Progress Notes (Signed)
 [facility] Department STI clinic [address] [place] [address] Main phone: [phone]  STI screening visit  Subjective:  Joe Shaffer is a 63 y.o. male being seen today for an STI visit. The patient reports they do have symptoms.    Patient has the following medical conditions:  There are no active problems to display for this patient.  Chief Complaint  Patient presents with   SEXUALLY TRANSMITTED DISEASE    HPI Pt is a noticeably confused gentleman presenting to clinic today for a shot. He states you know what I have and is adamant that he needs treatment. Was seen in June for STI screening- all results are negative. He hands me a card from August 10, 2023, and says this is the proof that he needs treatment. Reports he has the same burning he had then, but denies having any partners since last visit.   See flowsheet for further details and programmatic requirements  Hyperlink available at the top of the signed note in blue.  Flow sheet content below:  Pregnancy Intention Screening Does the patient want to become pregnant in the next year?: N/A Does the patient's partner want to become pregnant in the next year?: No Would the patient like to discuss contraceptive options today?: N/A Counseling Patient counseled to use condoms with all sex: Condoms declined RTC in 2-3 weeks for test results: Yes Clinic will call if test results abnormal before test result appt.: Yes Test results given to patient Patient counseled to use condoms with all sex: Condoms declined  Screening for MPX risk:  Unexplained rash?  No   MSM?  No   Multiple or anonymous sex partners?  No   Any close or sexual contact with a person  diagnosed with MPX?  No   Any outside the [place]  where MPX is endemic?  No   High clinical suspicion for MPX?    -Unlikely to be chickenpox    -Lymphadenopathy    -Rash that presents in same phase of       evolution on any given  body part  No   STI screening history: Last HIV test per patient/review of record was  Lab Results  Component Value Date   HMHIVSCREEN Negative - Validated 08/10/2023   No results found for: HIV  Last HEPC test per patient/review of record was  Lab Results  Component Value Date   HMHEPCSCREEN Negative-Validated 08/10/2023   No components found for: HEPC   Last HEPB test per patient/review of record was No components found for: HMHEPBSCREEN   Fertility: Does the patient or their partner desires a pregnancy in the next year? No  Immunization History  Administered Date(s) Administered   Tdap 12/19/2020    The following portions of the patient's history were reviewed and updated as appropriate: allergies, current medications, past medical history, past social history, past surgical history and problem list.  Objective:  There were no vitals filed for this visit.  Physical Exam Constitutional:      Appearance: He is normal weight.  Neurological:     Mental Status: He is alert.  Psychiatric:        Behavior: Behavior is agitated.        Cognition and Memory: Cognition is impaired.      Assessment and Plan:  Joe Shaffer is a 63 y.o. male presenting to the [facility] Department for STI screening  1. Screening for venereal disease (Primary)  - Chlamydia/GC NAA,  Confirmation - HIV [place] [facility] - Syphilis Serology, [facility]  2. Suspected condition not found -above screening performed at the insistence of patient -per chart review- was previously being seen by [facility] for memory loss- where it was documented he has a hx of EtOH (abstinent x 18 years) and has a hx of concussion. Started on aricept- unclear if he has been getting care somewhere else -today this patient is confused, and considering his complaint of burning with urination and negative STI screening- I suspect possible UTI vs memory loss -his speech at times was not  comprehensible- he did say his daughter brought him today -given our concern for his wellbeing- after review with Dr. Macario, attempts were made to call his daughter x2 -unable to reach her-- Patient was agreeable to going to the lab- and then asked for help to leave the building    Patient does not have STI symptoms Patient accepted the following screenings: urine CT/GC, HIV, and RPR Patient meets criteria for HepB screening? No. Ordered? not applicable Patient meets criteria for HepC screening? No. Ordered? not applicable Recommended condom use with all sex Discussed importance of condom use for STI prevention  Treat positive test results per standing order. Discussed time line for State Lab results and that patient will be called with positive results and encouraged patient to call if he had not heard in 2 weeks Recommended repeat testing in 3 months with positive results. Recommended returning for continued or worsening symptoms.   Return if symptoms worsen or fail to improve, for STI screening.  No future appointments.  Verneta Bers, [place]

## 2023-12-13 LAB — CHLAMYDIA/GC NAA, CONFIRMATION
Chlamydia trachomatis, NAA: NEGATIVE
Neisseria gonorrhoeae, NAA: NEGATIVE
